# Patient Record
Sex: Male | Born: 1965 | Race: Black or African American | Hispanic: No | Marital: Married | Smoking: Never smoker
Health system: Southern US, Community
[De-identification: ages and names within clinical notes are randomized; demographics above are authoritative.]

## PROBLEM LIST (undated history)

## (undated) DIAGNOSIS — N200 Calculus of kidney: Secondary | ICD-10-CM

## (undated) DIAGNOSIS — E7800 Pure hypercholesterolemia, unspecified: Secondary | ICD-10-CM

## (undated) HISTORY — PX: HERNIA REPAIR: SHX51

---

## 2001-07-31 ENCOUNTER — Ambulatory Visit (HOSPITAL_COMMUNITY): Admission: RE | Admit: 2001-07-31 | Discharge: 2001-07-31 | Payer: Self-pay | Admitting: *Deleted

## 2001-07-31 ENCOUNTER — Encounter: Payer: Self-pay | Admitting: *Deleted

## 2012-04-15 ENCOUNTER — Encounter (HOSPITAL_COMMUNITY): Payer: Self-pay | Admitting: *Deleted

## 2012-04-15 ENCOUNTER — Emergency Department (HOSPITAL_COMMUNITY)
Admission: EM | Admit: 2012-04-15 | Discharge: 2012-04-15 | Disposition: A | Discharge status: Home / Self Care | Payer: 59 | Attending: Emergency Medicine | Admitting: Emergency Medicine

## 2012-04-15 DIAGNOSIS — M5416 Radiculopathy, lumbar region: Secondary | ICD-10-CM

## 2012-04-15 DIAGNOSIS — IMO0002 Reserved for concepts with insufficient information to code with codable children: Secondary | ICD-10-CM | POA: Insufficient documentation

## 2012-04-15 MED ORDER — HYDROMORPHONE HCL PF 2 MG/ML IJ SOLN
2.0000 mg | Freq: Once | INTRAMUSCULAR | Status: AC
  Administered 2012-04-15: 2 mg via INTRAMUSCULAR
  Filled 2012-04-15: qty 1

## 2012-04-15 MED ORDER — HYDROMORPHONE HCL 4 MG PO TABS: 4.0000 mg | ORAL_TABLET | ORAL | Status: AC | PRN

## 2012-04-15 MED ORDER — PREDNISONE 20 MG PO TABS: ORAL_TABLET | ORAL | Status: AC

## 2012-04-15 MED ORDER — PREDNISONE 20 MG PO TABS
60.0000 mg | ORAL_TABLET | Freq: Once | ORAL | Status: AC
  Administered 2012-04-15: 60 mg via ORAL
  Filled 2012-04-15: qty 3

## 2012-04-15 NOTE — ED Notes (Signed)
Addition to musculoskeletal note. Upon assessment of pt's back no swelling or contusion was noted. Pt in nad. Patient is able to ambulate but uses crutches to relieve some of the pressure on his left leg.

## 2012-04-15 NOTE — ED Provider Notes (Signed)
History   This chart was scribed for Hurman Horn, MD by Melba Coon. The patient was seen in room TR04C/TR04C and the patient's care was started at 4:34PM.    CSN: [id number]  Arrival date & time 04/15/12  1451   None     Chief Complaint  Patient presents with  . Back Pain  . Leg Pain    (Consider location/radiation/quality/duration/timing/severity/associated sxs/prior treatment) The history is provided by the patient. No language interpreter was used.   Jim Brown is a 46 y.o. male who presents to the Emergency Department complaining of constant, moderate to severe lower back pain with an onset a few days ago. 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 Pt states that maybe while picking his son, he pulled a muscle. Pt was seen at a hospital in [place] and given pain meds (oxycodone) which did not alleviate the pain. Pt states that pain is now radiating down his left leg which is aggravated whenever he sits or lies down. Certain positions such as standing hunched over alleviate the pain. Outer foot also feels slightly numb and cold. Pt states that he took his wife's prednisone which alleviated the symptoms. No HA, fever, neck pain, sore throat, rash, CP, SOB, abd pain, n/v/d, dysuria, bowel or bladder dysfunction, or extremity edema, weakness, numbness, or tingling. No IV drug use. No known allergies. No other pertinent medical symptoms.  History reviewed. No pertinent past medical history.  History reviewed. No pertinent past surgical history.  History reviewed. No pertinent family history.  History  Substance Use Topics  . Smoking status: Not on file  . Smokeless tobacco: Not on file  . Alcohol Use: No      Review of Systems 10 Systems reviewed and all are negative for acute change except as noted in the HPI.   Allergies  Review of patient's allergies indicates no known allergies.  Home Medications   Current Outpatient  Rx  Name Route Sig Dispense Refill  . IBUPROFEN 400 MG PO TABS Oral Take 400 mg by mouth every 6 (six) hours as needed. For pain \    . LORATADINE-PSEUDOEPHEDRINE ER 10-240 MG PO TB24 Oral Take 1 tablet by mouth daily.    . OXYCODONE-ACETAMINOPHEN 5-325 MG PO TABS Oral Take 1 tablet by mouth every 6 (six) hours as needed. For pain    . HYDROMORPHONE HCL 4 MG PO TABS Oral Take 1 tablet (4 mg total) by mouth every 4 (four) hours as needed for pain. 15 tablet 0  . PREDNISONE 20 MG PO TABS  2 tabs po daily x 4 days 8 tablet 0    BP 136/78  Pulse 88  Temp 97.4 F (36.3 C) (Oral)  Resp 18  SpO2 98%  Physical Exam  Nursing note and vitals reviewed. Constitutional:       Awake, alert, nontoxic appearance with baseline speech.  HENT:  Head: Normocephalic and atraumatic.  Right Ear: External ear normal.  Left Ear: External ear normal.  Eyes: Conjunctivae are normal. Pupils are equal, round, and reactive to light. Right eye exhibits no discharge. Left eye exhibits no discharge.  Neck: Normal range of motion.  Cardiovascular: Normal rate, regular rhythm and normal heart sounds.   No murmur heard. Pulmonary/Chest: Effort normal and breath sounds normal. No respiratory distress. He has no wheezes. He has no rales. He exhibits no tenderness.  Abdominal: Soft. Bowel sounds are normal. He exhibits no mass. There is no  tenderness. There is no rebound.  Musculoskeletal: He exhibits no edema and no tenderness.       Thoracic back: He exhibits no tenderness.       Lumbar back: He exhibits no tenderness.       Bilateral lower extremities non tender without new rashes or color change, baseline ROM with intact DP / PT pulses, CR<2 secs all digits bilaterally, sensation baseline light touch bilaterally for pt, DTR's symmetric and intact bilaterally KJ / AJ, motor symmetric bilateral 5 / 5 hip flexion, quadriceps, hamstrings, EHL, foot dorsiflexion, foot plantarflexion, gait somewhat antalgic but without  apparent new ataxia.  Neurological: He has normal reflexes.       Mental status baseline for patient.  Upper extremity motor strength and sensation intact and symmetric bilaterally.  Skin: No rash noted.  Psychiatric: He has a normal mood and affect.    ED Course  Procedures (including critical care time)  DIAGNOSTIC STUDIES: Oxygen Saturation is 99% on room air, normal by my interpretation.    COORDINATION OF CARE:  4:38PM - pt has suspected herniated disc, pinched nerve, or sciatica; pt will be Rx prednisone and dilaudid for the pt; pt advised to f/u with PCP; pt ready for d/c.  Labs Reviewed - No data to display No results found.   1. Lumbar radiculopathy, acute       MDM   I personally performed the services described in this documentation, which was scribed in my presence. The recorded information has been reviewed and considered.  Patient / Family / Caregiver informed of clinical course, understand medical decision-making process, and agree with plan.        Hurman Horn, MD 04/22/12 [phone]

## 2012-04-15 NOTE — ED Notes (Signed)
Pt reports lower back pain for several days, was seen at hospital in [place] for it and given pain meds with no relief. Now has pain radiating down left leg when sitting or lying down.

## 2017-02-10 DIAGNOSIS — Z0100 Encounter for examination of eyes and vision without abnormal findings: Secondary | ICD-10-CM | POA: Diagnosis not present

## 2017-07-02 ENCOUNTER — Emergency Department (HOSPITAL_COMMUNITY): Payer: 59

## 2017-07-02 ENCOUNTER — Encounter (HOSPITAL_COMMUNITY): Payer: Self-pay | Admitting: Emergency Medicine

## 2017-07-02 ENCOUNTER — Emergency Department (HOSPITAL_COMMUNITY)
Admission: EM | Admit: 2017-07-02 | Discharge: 2017-07-02 | Disposition: A | Discharge status: Home / Self Care | Payer: 59 | Attending: Emergency Medicine | Admitting: Emergency Medicine

## 2017-07-02 DIAGNOSIS — N200 Calculus of kidney: Secondary | ICD-10-CM | POA: Insufficient documentation

## 2017-07-02 DIAGNOSIS — R109 Unspecified abdominal pain: Secondary | ICD-10-CM | POA: Diagnosis not present

## 2017-07-02 DIAGNOSIS — N132 Hydronephrosis with renal and ureteral calculous obstruction: Secondary | ICD-10-CM | POA: Diagnosis not present

## 2017-07-02 DIAGNOSIS — R1011 Right upper quadrant pain: Secondary | ICD-10-CM | POA: Diagnosis not present

## 2017-07-02 LAB — CBC WITH DIFFERENTIAL/PLATELET
Basophils Absolute: 0 10*3/uL (ref 0.0–0.1)
Basophils Relative: 0 %
Eosinophils Absolute: 0 10*3/uL (ref 0.0–0.7)
Eosinophils Relative: 0 %
HCT: 44.6 % (ref 39.0–52.0)
Hemoglobin: 14.4 g/dL (ref 13.0–17.0)
Lymphocytes Relative: 16 %
Lymphs Abs: 1.7 10*3/uL (ref 0.7–4.0)
MCH: 26.7 pg (ref 26.0–34.0)
MCHC: 32.3 g/dL (ref 30.0–36.0)
MCV: 82.7 fL (ref 78.0–100.0)
Monocytes Absolute: 0.4 10*3/uL (ref 0.1–1.0)
Monocytes Relative: 3 %
Neutro Abs: 8.1 10*3/uL — ABNORMAL HIGH (ref 1.7–7.7)
Neutrophils Relative %: 81 %
Platelets: 291 10*3/uL (ref 150–400)
RBC: 5.39 MIL/uL (ref 4.22–5.81)
RDW: 14.3 % (ref 11.5–15.5)
WBC: 10.2 10*3/uL (ref 4.0–10.5)

## 2017-07-02 LAB — BASIC METABOLIC PANEL
Anion gap: 8 (ref 5–15)
BUN: 13 mg/dL (ref 6–20)
CO2: 25 mmol/L (ref 22–32)
Calcium: 8.9 mg/dL (ref 8.9–10.3)
Chloride: 103 mmol/L (ref 101–111)
Creatinine, Ser: 1.43 mg/dL — ABNORMAL HIGH (ref 0.61–1.24)
GFR calc Af Amer: >60 mL/min (ref >60)
GFR calc non Af Amer: 56 mL/min — ABNORMAL LOW (ref >60)
Glucose, Bld: 114 mg/dL — ABNORMAL HIGH (ref 65–99)
Potassium: 3.8 mmol/L (ref 3.5–5.1)
Sodium: 136 mmol/L (ref 135–145)

## 2017-07-02 LAB — URINALYSIS, ROUTINE W REFLEX MICROSCOPIC
Bacteria, UA: NONE SEEN
Bilirubin Urine: NEGATIVE
Glucose, UA: NEGATIVE mg/dL
Ketones, ur: NEGATIVE mg/dL
Leukocytes, UA: NEGATIVE
Nitrite: NEGATIVE
Protein, ur: NEGATIVE mg/dL
Specific Gravity, Urine: 1.02 (ref 1.005–1.030)
Squamous Epithelial / LPF: NONE SEEN
pH: 6 (ref 5.0–8.0)

## 2017-07-02 MED ORDER — FENTANYL CITRATE (PF) 100 MCG/2ML IJ SOLN
50.0000 ug | INTRAMUSCULAR | Status: DC | PRN
  Administered 2017-07-02: 50 ug via NASAL

## 2017-07-02 MED ORDER — FENTANYL CITRATE (PF) 100 MCG/2ML IJ SOLN
INTRAMUSCULAR | Status: AC
  Filled 2017-07-02: qty 2

## 2017-07-02 MED ORDER — MORPHINE SULFATE (PF) 4 MG/ML IV SOLN
4.0000 mg | Freq: Once | INTRAVENOUS | Status: AC
  Administered 2017-07-02: 4 mg via INTRAVENOUS
  Filled 2017-07-02: qty 1

## 2017-07-02 MED ORDER — KETOROLAC TROMETHAMINE 30 MG/ML IJ SOLN
30.0000 mg | Freq: Once | INTRAMUSCULAR | Status: AC
  Administered 2017-07-02: 30 mg via INTRAVENOUS
  Filled 2017-07-02: qty 1

## 2017-07-02 MED ORDER — ONDANSETRON 4 MG PO TBDP
ORAL_TABLET | ORAL | Status: AC
  Administered 2017-07-02: 4 mg
  Filled 2017-07-02: qty 1

## 2017-07-02 MED ORDER — ONDANSETRON HCL 4 MG/2ML IJ SOLN
4.0000 mg | Freq: Once | INTRAMUSCULAR | Status: AC
  Administered 2017-07-02: 4 mg via INTRAVENOUS
  Filled 2017-07-02: qty 2

## 2017-07-02 MED ORDER — TAMSULOSIN HCL 0.4 MG PO CAPS: 0.4000 mg | ORAL_CAPSULE | Freq: Every day | ORAL | 0 refills | Status: DC

## 2017-07-02 MED ORDER — OXYCODONE-ACETAMINOPHEN 5-325 MG PO TABS: 1.0000 | ORAL_TABLET | Freq: Four times a day (QID) | ORAL | 0 refills | Status: DC | PRN

## 2017-07-02 MED ORDER — ONDANSETRON 4 MG PO TBDP: ORAL_TABLET | ORAL | 0 refills | Status: DC

## 2017-07-02 NOTE — ED Notes (Signed)
Pt to CT

## 2017-07-02 NOTE — ED Notes (Signed)
Pt wife came to [facility] stating "do you know how much longer, he is in so much pain. Would we do better to go urgent care or [place]." Wife was informed that it is recommend that they stay and that hopefully he would be going back soon. Wife walked back to the chairs and sat back down.

## 2017-07-02 NOTE — ED Triage Notes (Signed)
Pt. Stated, I woke up and bent over to brush my teeth and I started having this sharp pain on th rt. Side (flank) are. Its sharp and constant.

## 2017-07-02 NOTE — ED Provider Notes (Signed)
[facility] EM[facility]GENCY [facility] Provider Note   CSN: [id number] Arrival date & time: 07/02/17  [id number]     History   Chief Complaint Chief Complaint  Patient presents with  . Flank Pain    HPI Jim Brown is a 51 y.o. male.  Patient is a 51 year old male who presents with right flank pain. He states he was brushing his teeth and had a sudden onset of pain in his right back that radiates down to his right lower abdomen. It started couple of hours ago this morning. He has some associated nausea but no vomiting. He describes as a constant throbbing pain. It's a little bit worse with movement. He denies any urinary symptoms. No fevers. No prior history of similar symptoms. No numbness or weakness to his extremities.  He has not taken anything at home for the pain.      History reviewed. No pertinent past medical history.  There are no active problems to display for this patient.   History reviewed. No pertinent surgical history.     Home Medications    Prior to Admission medications   Medication Sig Start Date End Date Taking? Authorizing Provider  cyclobenzaprine (FLEXERIL) 10 MG tablet Take 10 mg by mouth 3 (three) times daily as needed for muscle spasms.   Yes [provider]  fluticasone (FLONASE) 50 MCG/ACT nasal spray Place 1 spray into both nostrils daily as needed for allergies.  04/29/17  Yes [provider]  ibuprofen (ADVIL,MOTRIN) 400 MG tablet Take 400 mg by mouth every 6 (six) hours as needed. For pain \   Yes [provider]  loratadine-pseudoephedrine (CLARITIN-D 24-HOUR) 10-240 MG 24 hr tablet Take 1 tablet by mouth daily as needed for allergies.  04/29/17  Yes [provider]  naproxen sodium (ANAPROX) 220 MG tablet Take 440 mg by mouth daily as needed (general pain).   Yes [provider]  omeprazole (PRILOSEC OTC) 20 MG tablet Take 20 mg by mouth daily as needed (heartburn).  04/29/17  Yes  [provider]  sildenafil (VIAGRA) 100 MG tablet Take 50 mg by mouth daily as needed for erectile dysfunction. 04/29/17  Yes [provider]  ondansetron (ZOFRAN ODT) 4 MG disintegrating tablet 4mg  ODT q4 hours prn nausea/vomit 07/02/17   Rolan BuccoBelfi, Reneisha Stilley, MD  oxyCODONE-acetaminophen (PERCOCET) 5-325 MG tablet Take 1-2 tablets by mouth every 6 (six) hours as needed. 07/02/17   Rolan BuccoBelfi, Jeramy Dimmick, MD  tamsulosin (FLOMAX) 0.4 MG CAPS capsule Take 1 capsule (0.4 mg total) by mouth daily. 07/02/17   Rolan BuccoBelfi, Kayman Snuffer, MD    Family History No family history on file.  Social History Social History  Substance Use Topics  . Smoking status: Never Smoker  . Smokeless tobacco: Never Used  . Alcohol use No     Allergies   Patient has no known allergies.   Review of Systems Review of Systems  Constitutional: Negative for chills, diaphoresis, fatigue and fever.  HENT: Negative for congestion, rhinorrhea and sneezing.   Eyes: Negative.   Respiratory: Negative for cough, chest tightness and shortness of breath.   Cardiovascular: Negative for chest pain and leg swelling.  Gastrointestinal: Positive for abdominal pain and nausea. Negative for blood in stool, diarrhea and vomiting.  Genitourinary: Positive for flank pain. Negative for difficulty urinating, frequency and hematuria.  Musculoskeletal: Negative for arthralgias and back pain.  Skin: Negative for rash.  Neurological: Negative for dizziness, speech difficulty, weakness, numbness and headaches.     Physical Exam  Updated Vital Signs BP 134/82 (BP Location: Right Arm)   Pulse 64   Temp 98.2 F (36.8 C) (Oral)   Resp 16   Ht 6\' 1"  (1.854 m)   Wt 106.6 kg (235 lb)   SpO2 99%   BMI 31.00 kg/m   Physical Exam  Constitutional: He is oriented to person, place, and time. He appears well-developed and well-nourished. He appears distressed.  Appears uncomfortable  HENT:  Head: Normocephalic and atraumatic.  Eyes: Pupils  are equal, round, and reactive to light.  Neck: Normal range of motion. Neck supple.  Cardiovascular: Normal rate, regular rhythm and normal heart sounds.   Pulmonary/Chest: Effort normal and breath sounds normal. No respiratory distress. He has no wheezes. He has no rales. He exhibits no tenderness.  Abdominal: Soft. Bowel sounds are normal. There is tenderness (patient has tenderness in the right mid back and lower abdomen. There is no discrete tenderness to the testicular or inguinal area). There is no rebound and no guarding.  Musculoskeletal: Normal range of motion. He exhibits no edema.  Lymphadenopathy:    He has no cervical adenopathy.  Neurological: He is alert and oriented to person, place, and time.  Skin: Skin is warm and dry. No rash noted.  Psychiatric: He has a normal mood and affect.     ED Treatments / Results  Labs (all labs ordered are listed, but only abnormal results are displayed) Labs Reviewed  URINALYSIS, ROUTINE W REFLEX MICROSCOPIC - Abnormal; Notable for the following:       Result Value   Hgb urine dipstick SMALL (*)    All other components within normal limits  BASIC METABOLIC PANEL - Abnormal; Notable for the following:    Glucose, Bld 114 (*)    Creatinine, Ser 1.43 (*)    GFR calc non Af Amer 56 (*)    All other components within normal limits  CBC WITH DIFFERENTIAL/PLATELET - Abnormal; Notable for the following:    Neutro Abs 8.1 (*)    All other components within normal limits    EKG  EKG Interpretation None       Radiology Ct Renal Stone Study  Result Date: 07/02/2017 CLINICAL DATA:  51 year old male with acute right flank and abdominal pain. Initial encounter. EXAM: CT ABDOMEN AND PELVIS WITHOUT CONTRAST TECHNIQUE: Multidetector CT imaging of the abdomen and pelvis was performed following the standard protocol without IV contrast. COMPARISON:  None. FINDINGS: Please note that parenchymal abnormalities may be missed without intravenous  contrast. Lower chest: No acute abnormality.  Cardiomegaly identified. Hepatobiliary: The liver and gallbladder are unremarkable. No biliary dilatation. Pancreas: Unremarkable Spleen: Unremarkable Adrenals/Urinary Tract: A punctate right UVJ calculus causes mild right hydroureteronephrosis. The kidneys and adrenal glands are otherwise unremarkable. Stomach/Bowel: Stomach is within normal limits. Appendix appears normal. No evidence of bowel wall thickening, distention, or inflammatory changes. Vascular/Lymphatic: No significant vascular findings are present. No enlarged abdominal or pelvic lymph nodes. Reproductive: Prostate is unremarkable. Other: No abdominal wall hernia or abnormality. No abdominopelvic ascites. Musculoskeletal: No acute abnormality. Moderate degenerative disc disease at L5-S1 noted. IMPRESSION: 1. Punctate right UVJ calculus causing mild right hydroureteronephrosis. 2. Cardiomegaly Electronically Signed   By: Harmon Pier M.D.   On: 07/02/2017 10:53    Procedures Procedures (including critical care time)  Medications Ordered in ED Medications  fentaNYL (SUBLIMAZE) injection 50 mcg (50 mcg Nasal Given 07/02/17 0813)  fentaNYL (SUBLIMAZE) 100 MCG/2ML injection (not administered)  ondansetron (ZOFRAN-ODT) 4 MG disintegrating tablet (4 mg  Given  07/02/17 0814)  ketorolac (TORADOL) 30 MG/ML injection 30 mg (30 mg Intravenous Given 07/02/17 0951)  morphine 4 MG/ML injection 4 mg (4 mg Intravenous Given 07/02/17 0951)  ondansetron (ZOFRAN) injection 4 mg (4 mg Intravenous Given 07/02/17 0951)  morphine 4 MG/ML injection 4 mg (4 mg Intravenous Given 07/02/17 1030)     Initial Impression / Assessment and Plan / ED Course  I have reviewed the triage vital signs and the nursing notes.  Pertinent labs & imaging results that were available during my care of the patient were reviewed by me and considered in my medical decision making (see chart for details).     Patient is a  51 year old male who presents with flank pain. He has evidence of a kidney stone. There is no evidence of a urinary tract infection. His pain is controlled in the ED. His kidney stone is punctate and has a good chance of passing without intervention. He was discharged home in good condition. He was given prescriptions for symptomatic relief. He was given a referral to follow-up with [facility]. Return precautions were given.  Final Clinical Impressions(s) / ED Diagnoses   Final diagnoses:  Kidney stone    New Prescriptions New Prescriptions   ONDANSETRON (ZOFRAN ODT) 4 MG DISINTEGRATING TABLET    4mg  ODT q4 hours prn nausea/vomit   OXYCODONE-ACETAMINOPHEN (PERCOCET) 5-325 MG TABLET    Take 1-2 tablets by mouth every 6 (six) hours as needed.   TAMSULOSIN (FLOMAX) 0.4 MG CAPS CAPSULE    Take 1 capsule (0.4 mg total) by mouth daily.     Rolan Bucco, MD 07/02/17 [phone]

## 2018-01-13 DIAGNOSIS — E7800 Pure hypercholesterolemia, unspecified: Secondary | ICD-10-CM | POA: Diagnosis not present

## 2018-01-13 DIAGNOSIS — Z1211 Encounter for screening for malignant neoplasm of colon: Secondary | ICD-10-CM | POA: Diagnosis not present

## 2018-04-14 DIAGNOSIS — Z0100 Encounter for examination of eyes and vision without abnormal findings: Secondary | ICD-10-CM | POA: Diagnosis not present

## 2018-05-12 DIAGNOSIS — Z1211 Encounter for screening for malignant neoplasm of colon: Secondary | ICD-10-CM | POA: Diagnosis not present

## 2018-05-12 DIAGNOSIS — K635 Polyp of colon: Secondary | ICD-10-CM | POA: Diagnosis not present

## 2018-05-29 ENCOUNTER — Ambulatory Visit
Admission: RE | Admit: 2018-05-29 | Discharge: 2018-05-29 | Disposition: A | Discharge status: Home / Self Care | Payer: 59 | Source: Ambulatory Visit | Attending: Internal Medicine | Admitting: Internal Medicine

## 2018-05-29 ENCOUNTER — Other Ambulatory Visit: Payer: Self-pay | Admitting: Internal Medicine

## 2018-05-29 DIAGNOSIS — N200 Calculus of kidney: Secondary | ICD-10-CM

## 2018-05-29 DIAGNOSIS — R109 Unspecified abdominal pain: Secondary | ICD-10-CM

## 2018-06-20 DIAGNOSIS — Z125 Encounter for screening for malignant neoplasm of prostate: Secondary | ICD-10-CM | POA: Diagnosis not present

## 2018-06-20 DIAGNOSIS — N201 Calculus of ureter: Secondary | ICD-10-CM | POA: Diagnosis not present

## 2018-08-31 DIAGNOSIS — Z23 Encounter for immunization: Secondary | ICD-10-CM | POA: Diagnosis not present

## 2018-08-31 DIAGNOSIS — Z0000 Encounter for general adult medical examination without abnormal findings: Secondary | ICD-10-CM | POA: Diagnosis not present

## 2018-08-31 DIAGNOSIS — E7800 Pure hypercholesterolemia, unspecified: Secondary | ICD-10-CM | POA: Diagnosis not present

## 2020-11-29 ENCOUNTER — Emergency Department (HOSPITAL_COMMUNITY)
Admission: EM | Admit: 2020-11-29 | Discharge: 2020-11-29 | Disposition: A | Discharge status: Home / Self Care | Payer: 59 | Attending: Emergency Medicine | Admitting: Emergency Medicine

## 2020-11-29 ENCOUNTER — Emergency Department (HOSPITAL_COMMUNITY): Payer: 59

## 2020-11-29 ENCOUNTER — Encounter (HOSPITAL_COMMUNITY): Payer: Self-pay

## 2020-11-29 DIAGNOSIS — S0990XA Unspecified injury of head, initial encounter: Secondary | ICD-10-CM | POA: Diagnosis not present

## 2020-11-29 DIAGNOSIS — Z79899 Other long term (current) drug therapy: Secondary | ICD-10-CM | POA: Diagnosis not present

## 2020-11-29 DIAGNOSIS — Y92410 Unspecified street and highway as the place of occurrence of the external cause: Secondary | ICD-10-CM | POA: Diagnosis not present

## 2020-11-29 DIAGNOSIS — M549 Dorsalgia, unspecified: Secondary | ICD-10-CM | POA: Insufficient documentation

## 2020-11-29 DIAGNOSIS — S3991XA Unspecified injury of abdomen, initial encounter: Secondary | ICD-10-CM | POA: Insufficient documentation

## 2020-11-29 DIAGNOSIS — S50812A Abrasion of left forearm, initial encounter: Secondary | ICD-10-CM | POA: Insufficient documentation

## 2020-11-29 DIAGNOSIS — S59912A Unspecified injury of left forearm, initial encounter: Secondary | ICD-10-CM | POA: Diagnosis present

## 2020-11-29 HISTORY — DX: Calculus of kidney: N20.0

## 2020-11-29 HISTORY — DX: Pure hypercholesterolemia, unspecified: E78.00

## 2020-11-29 LAB — COMPREHENSIVE METABOLIC PANEL
ALT: 25 U/L (ref 0–44)
AST: 35 U/L (ref 15–41)
Albumin: 4.1 g/dL (ref 3.5–5.0)
Alkaline Phosphatase: 76 U/L (ref 38–126)
Anion gap: 9 (ref 5–15)
BUN: 13 mg/dL (ref 6–20)
CO2: 26 mmol/L (ref 22–32)
Calcium: 9.6 mg/dL (ref 8.9–10.3)
Chloride: 105 mmol/L (ref 98–111)
Creatinine, Ser: 1.53 mg/dL — ABNORMAL HIGH (ref 0.61–1.24)
GFR, Estimated: 54 mL/min — ABNORMAL LOW (ref >60)
Glucose, Bld: 101 mg/dL — ABNORMAL HIGH (ref 70–99)
Potassium: 3.8 mmol/L (ref 3.5–5.1)
Sodium: 140 mmol/L (ref 135–145)
Total Bilirubin: 1.2 mg/dL (ref 0.3–1.2)
Total Protein: 7.7 g/dL (ref 6.5–8.1)

## 2020-11-29 LAB — I-STAT CHEM 8, ED
BUN: 15 mg/dL (ref 6–20)
Calcium, Ion: 1.25 mmol/L (ref 1.15–1.40)
Chloride: 106 mmol/L (ref 98–111)
Creatinine, Ser: 1.5 mg/dL — ABNORMAL HIGH (ref 0.61–1.24)
Glucose, Bld: 97 mg/dL (ref 70–99)
HCT: 48 % (ref 39.0–52.0)
Hemoglobin: 16.3 g/dL (ref 13.0–17.0)
Potassium: 3.8 mmol/L (ref 3.5–5.1)
Sodium: 142 mmol/L (ref 135–145)
TCO2: 25 mmol/L (ref 22–32)

## 2020-11-29 LAB — ETHANOL: Alcohol, Ethyl (B): <10 mg/dL (ref <10)

## 2020-11-29 LAB — URINALYSIS, ROUTINE W REFLEX MICROSCOPIC
Bilirubin Urine: NEGATIVE
Glucose, UA: NEGATIVE mg/dL
Hgb urine dipstick: NEGATIVE
Ketones, ur: NEGATIVE mg/dL
Leukocytes,Ua: NEGATIVE
Nitrite: NEGATIVE
Protein, ur: NEGATIVE mg/dL
Specific Gravity, Urine: 1.035 — ABNORMAL HIGH (ref 1.005–1.030)
pH: 7 (ref 5.0–8.0)

## 2020-11-29 LAB — CBC
HCT: 47.3 % (ref 39.0–52.0)
Hemoglobin: 15.5 g/dL (ref 13.0–17.0)
MCH: 27.2 pg (ref 26.0–34.0)
MCHC: 32.8 g/dL (ref 30.0–36.0)
MCV: 83.1 fL (ref 80.0–100.0)
Platelets: 342 10*3/uL (ref 150–400)
RBC: 5.69 MIL/uL (ref 4.22–5.81)
RDW: 13.9 % (ref 11.5–15.5)
WBC: 11.1 10*3/uL — ABNORMAL HIGH (ref 4.0–10.5)
nRBC: 0 % (ref 0.0–0.2)

## 2020-11-29 LAB — PROTIME-INR
INR: 1 (ref 0.8–1.2)
Prothrombin Time: 12.8 seconds (ref 11.4–15.2)

## 2020-11-29 LAB — LACTIC ACID, PLASMA: Lactic Acid, Venous: 2 mmol/L (ref 0.5–1.9)

## 2020-11-29 MED ORDER — METHOCARBAMOL 500 MG PO TABS: 1000.0000 mg | ORAL_TABLET | Freq: Three times a day (TID) | ORAL | 0 refills | Status: DC | PRN

## 2020-11-29 MED ORDER — IBUPROFEN 800 MG PO TABS: 800.0000 mg | ORAL_TABLET | Freq: Three times a day (TID) | ORAL | 0 refills | Status: DC

## 2020-11-29 MED ORDER — FENTANYL CITRATE (PF) 100 MCG/2ML IJ SOLN
50.0000 ug | Freq: Once | INTRAMUSCULAR | Status: AC
  Administered 2020-11-29: 50 ug via INTRAVENOUS
  Filled 2020-11-29: qty 2

## 2020-11-29 MED ORDER — HYDROMORPHONE HCL 1 MG/ML IJ SOLN
1.0000 mg | Freq: Once | INTRAMUSCULAR | Status: AC
  Administered 2020-11-29: 1 mg via INTRAVENOUS
  Filled 2020-11-29: qty 1

## 2020-11-29 MED ORDER — OXYCODONE-ACETAMINOPHEN 5-325 MG PO TABS: 1.0000 | ORAL_TABLET | Freq: Four times a day (QID) | ORAL | 0 refills | Status: DC | PRN

## 2020-11-29 MED ORDER — BACITRACIN ZINC 500 UNIT/GM EX OINT: 1.0000 "application " | TOPICAL_OINTMENT | Freq: Two times a day (BID) | CUTANEOUS | 0 refills | Status: DC

## 2020-11-29 MED ORDER — IOHEXOL 300 MG/ML  SOLN
100.0000 mL | Freq: Once | INTRAMUSCULAR | Status: AC | PRN
  Administered 2020-11-29: 100 mL via INTRAVENOUS

## 2020-11-29 MED ORDER — SODIUM CHLORIDE 0.9 % IV BOLUS
125.0000 mL | Freq: Once | INTRAVENOUS | Status: AC
  Administered 2020-11-29: 125 mL via INTRAVENOUS

## 2020-11-29 MED ORDER — SODIUM CHLORIDE 0.9 % IV BOLUS
500.0000 mL | Freq: Once | INTRAVENOUS | Status: AC
  Administered 2020-11-29: 500 mL via INTRAVENOUS

## 2020-11-29 MED ORDER — KETOROLAC TROMETHAMINE 30 MG/ML IJ SOLN
30.0000 mg | Freq: Once | INTRAMUSCULAR | Status: AC
  Administered 2020-11-29: 30 mg via INTRAVENOUS
  Filled 2020-11-29: qty 1
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
## 2020-11-29 NOTE — ED Notes (Signed)
Spouse at bedside, patient talkative, stable.

## 2020-11-29 NOTE — ED Notes (Signed)
To CT with RN.
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
## 2020-11-29 NOTE — Discharge Instructions (Signed)
1.  Will likely have a lot of sore muscles and areas of strain and bruising that you have not yet identified.  Apply well wrapped ice packs for about 20 minutes every 2 hours.  Take ibuprofen 800 mg every 8 hours with food.  You may also take the muscle relaxer Robaxin for muscle spasm or tightness.  For severe pain either due to your abrasion or from your injuries, you may also take 1-2 Percocet tablets as needed.  Try to taper down and discontinue Percocet as soon as pain is tolerable.  Once you have stopped taking Percocet you may substitute over-the-counter acetaminophen. 3.  You have a large abrasion to your left forearm.  Leave your current dressing in place for 48 hours.  Then start cleaning your wound with very mild soapy wound cleanser and rinse well.  Pat dry and apply bacitracin ointment and a clean dressing. 4.  Make appointment to see your doctor for recheck within the next 3 to 4 days.

## 2020-11-29 NOTE — ED Notes (Signed)
Patient transported to CT 

## 2020-11-29 NOTE — ED Notes (Signed)
Discharge instructions including pain management, medications, and follow up care discussed with pt. Pt verbalized understanding with no questions at this time. Pt to go home with wife at bedside. Ambulatory at discharge.
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
## 2024-10-15 ENCOUNTER — Other Ambulatory Visit: Payer: Self-pay

## 2024-10-15 ENCOUNTER — Encounter (HOSPITAL_BASED_OUTPATIENT_CLINIC_OR_DEPARTMENT_OTHER): Payer: Self-pay | Admitting: *Deleted

## 2024-10-15 ENCOUNTER — Emergency Department (HOSPITAL_BASED_OUTPATIENT_CLINIC_OR_DEPARTMENT_OTHER)
Admission: EM | Admit: 2024-10-15 | Discharge: 2024-10-15 | Disposition: A | Discharge status: Home / Self Care | Attending: Emergency Medicine | Admitting: Emergency Medicine

## 2024-10-15 ENCOUNTER — Emergency Department (HOSPITAL_BASED_OUTPATIENT_CLINIC_OR_DEPARTMENT_OTHER)

## 2024-10-15 DIAGNOSIS — F17200 Nicotine dependence, unspecified, uncomplicated: Secondary | ICD-10-CM | POA: Insufficient documentation

## 2024-10-15 DIAGNOSIS — K869 Disease of pancreas, unspecified: Secondary | ICD-10-CM

## 2024-10-15 DIAGNOSIS — R1084 Generalized abdominal pain: Secondary | ICD-10-CM

## 2024-10-15 DIAGNOSIS — K8689 Other specified diseases of pancreas: Secondary | ICD-10-CM | POA: Insufficient documentation

## 2024-10-15 LAB — CBC WITH DIFFERENTIAL/PLATELET
Abs Immature Granulocytes: 0.04 10*3/uL (ref 0.00–0.07)
Basophils Absolute: 0 10*3/uL (ref 0.0–0.1)
Basophils Relative: 0 %
Eosinophils Absolute: 0 10*3/uL (ref 0.0–0.5)
Eosinophils Relative: 0 %
HCT: 47.9 % (ref 39.0–52.0)
Hemoglobin: 15.9 g/dL (ref 13.0–17.0)
Immature Granulocytes: 0 %
Lymphocytes Relative: 8 %
Lymphs Abs: 0.8 10*3/uL (ref 0.7–4.0)
MCH: 27.5 pg (ref 26.0–34.0)
MCHC: 33.2 g/dL (ref 30.0–36.0)
MCV: 82.9 fL (ref 80.0–100.0)
Monocytes Absolute: 0.7 10*3/uL (ref 0.1–1.0)
Monocytes Relative: 6 %
Neutro Abs: 8.8 10*3/uL — ABNORMAL HIGH (ref 1.7–7.7)
Neutrophils Relative %: 86 %
Platelets: 277 10*3/uL (ref 150–400)
RBC: 5.78 MIL/uL (ref 4.22–5.81)
RDW: 13.7 % (ref 11.5–15.5)
WBC: 10.3 10*3/uL (ref 4.0–10.5)
nRBC: 0 % (ref 0.0–0.2)

## 2024-10-15 LAB — COMPREHENSIVE METABOLIC PANEL WITH GFR
ALT: 19 U/L (ref 0–44)
AST: 28 U/L (ref 15–41)
Albumin: 4.4 g/dL (ref 3.5–5.0)
Alkaline Phosphatase: 93 U/L (ref 38–126)
Anion gap: 14 (ref 5–15)
BUN: 16 mg/dL (ref 6–20)
CO2: 23 mmol/L (ref 22–32)
Calcium: 9.5 mg/dL (ref 8.9–10.3)
Chloride: 103 mmol/L (ref 98–111)
Creatinine, Ser: 1.26 mg/dL — ABNORMAL HIGH (ref 0.61–1.24)
GFR, Estimated: >60 mL/min
Glucose, Bld: 132 mg/dL — ABNORMAL HIGH (ref 70–99)
Potassium: 3.8 mmol/L (ref 3.5–5.1)
Sodium: 140 mmol/L (ref 135–145)
Total Bilirubin: 1.2 mg/dL (ref 0.0–1.2)
Total Protein: 7.9 g/dL (ref 6.5–8.1)

## 2024-10-15 LAB — LIPASE, BLOOD: Lipase: 25 U/L (ref 11–51)

## 2024-10-15 MED ORDER — ONDANSETRON 4 MG PO TBDP: 4.0000 mg | ORAL_TABLET | Freq: Three times a day (TID) | ORAL | 0 refills | Status: AC | PRN

## 2024-10-15 MED ORDER — LACTATED RINGERS IV BOLUS
1000.0000 mL | Freq: Once | INTRAVENOUS | Status: AC
  Administered 2024-10-15: 1000 mL via INTRAVENOUS

## 2024-10-15 MED ORDER — FENTANYL CITRATE (PF) 50 MCG/ML IJ SOSY
50.0000 ug | PREFILLED_SYRINGE | Freq: Once | INTRAMUSCULAR | Status: AC
  Administered 2024-10-15: 50 ug via INTRAVENOUS
  Filled 2024-10-15: qty 1

## 2024-10-15 MED ORDER — PANTOPRAZOLE SODIUM 20 MG PO TBEC: 40.0000 mg | DELAYED_RELEASE_TABLET | Freq: Every day | ORAL | 0 refills | Status: AC

## 2024-10-15 MED ORDER — PANTOPRAZOLE SODIUM 40 MG IV SOLR
40.0000 mg | Freq: Once | INTRAVENOUS | Status: AC
  Administered 2024-10-15: 40 mg via INTRAVENOUS
  Filled 2024-10-15: qty 10

## 2024-10-15 MED ORDER — SIMETHICONE 40 MG/0.6ML PO SUSP (UNIT DOSE)
40.0000 mg | Freq: Once | ORAL | Status: AC
  Administered 2024-10-15: 40 mg via ORAL
  Filled 2024-10-15: qty 0.6

## 2024-10-15 MED ORDER — SIMETHICONE 41.667 MG PO CHEW: 1.0000 | CHEWABLE_TABLET | ORAL | 0 refills | Status: AC | PRN

## 2024-10-15 MED ORDER — ONDANSETRON HCL 4 MG/2ML IJ SOLN
4.0000 mg | Freq: Once | INTRAMUSCULAR | Status: AC
  Administered 2024-10-15: 4 mg via INTRAVENOUS
  Filled 2024-10-15: qty 2

## 2024-10-15 MED ORDER — IOHEXOL 300 MG/ML  SOLN
100.0000 mL | Freq: Once | INTRAMUSCULAR | Status: AC | PRN
  Administered 2024-10-15: 100 mL via INTRAVENOUS

## 2024-10-15 NOTE — ED Triage Notes (Signed)
 C/o general abd pain that started around 3pm yesterday. States he feels bloated and consptiated. Last BM yesterday and states normal. C/o nausea. Denies any urinary symptoms. C/o of reflux symptoms. Took a prilosec at 6pm. And took a ducolax as well.
